# Patient Record
Sex: Male | Born: 1965 | Race: White | Hispanic: No | Marital: Married | Smoking: Current every day smoker
Health system: Southern US, Community
[De-identification: ages and names within clinical notes are randomized; demographics above are authoritative.]

## PROBLEM LIST (undated history)

## (undated) DIAGNOSIS — G4730 Sleep apnea, unspecified: Secondary | ICD-10-CM

## (undated) DIAGNOSIS — I10 Essential (primary) hypertension: Secondary | ICD-10-CM

## (undated) DIAGNOSIS — E785 Hyperlipidemia, unspecified: Secondary | ICD-10-CM

## (undated) DIAGNOSIS — K649 Unspecified hemorrhoids: Secondary | ICD-10-CM

## (undated) HISTORY — DX: Essential (primary) hypertension: I10

## (undated) HISTORY — PX: COLONOSCOPY: SHX174

## (undated) HISTORY — DX: Hyperlipidemia, unspecified: E78.5

---

## 2005-07-03 ENCOUNTER — Ambulatory Visit: Payer: Self-pay

## 2013-10-17 DIAGNOSIS — Z8719 Personal history of other diseases of the digestive system: Secondary | ICD-10-CM | POA: Insufficient documentation

## 2013-10-17 DIAGNOSIS — G4730 Sleep apnea, unspecified: Secondary | ICD-10-CM | POA: Insufficient documentation

## 2020-01-05 ENCOUNTER — Telehealth: Payer: Self-pay | Admitting: *Deleted

## 2020-01-05 NOTE — Telephone Encounter (Signed)
Attempted to contact regarding lung screening referral. No answer or voicemail option available.

## 2020-01-05 NOTE — Telephone Encounter (Signed)
Received a referral for lung screening scan. Attempted to contact but there is no answer or voicemail option available.

## 2020-01-17 ENCOUNTER — Telehealth: Payer: Self-pay | Admitting: *Deleted

## 2020-01-17 NOTE — Telephone Encounter (Signed)
Unsuccessfully attempted to contact regarding referral for lung cancer screening scan. Unable to leave message.

## 2020-02-02 ENCOUNTER — Telehealth: Payer: Self-pay | Admitting: *Deleted

## 2020-02-02 ENCOUNTER — Encounter: Payer: Self-pay | Admitting: *Deleted

## 2020-02-02 NOTE — Telephone Encounter (Signed)
Attempted to contact patient regarding lung screening scan. There is no answer or voicemail option. Will mail notification

## 2020-04-17 ENCOUNTER — Telehealth: Payer: Self-pay | Admitting: *Deleted

## 2020-04-17 DIAGNOSIS — Z87891 Personal history of nicotine dependence: Secondary | ICD-10-CM

## 2020-04-17 DIAGNOSIS — Z122 Encounter for screening for malignant neoplasm of respiratory organs: Secondary | ICD-10-CM

## 2020-04-17 NOTE — Telephone Encounter (Signed)
Received referral for initial lung cancer screening scan. Contacted patient and obtained smoking history,(current, 37 pack year) as well as answering questions related to screening process. Patient denies signs of lung cancer such as weight loss or hemoptysis. Patient denies comorbidity that would prevent curative treatment if lung cancer were found. Patient is scheduled for shared decision making visit and CT scan on 05/10/20 at 1015am.

## 2020-05-10 ENCOUNTER — Inpatient Hospital Stay: Payer: 59 | Admitting: Nurse Practitioner

## 2020-05-10 ENCOUNTER — Ambulatory Visit: Payer: 59

## 2020-05-22 ENCOUNTER — Inpatient Hospital Stay: Payer: 59 | Attending: Oncology | Admitting: Nurse Practitioner

## 2020-05-22 ENCOUNTER — Other Ambulatory Visit: Payer: Self-pay

## 2020-05-22 ENCOUNTER — Ambulatory Visit
Admission: RE | Admit: 2020-05-22 | Discharge: 2020-05-22 | Disposition: A | Discharge status: Home / Self Care | Payer: 59 | Source: Ambulatory Visit | Attending: Nurse Practitioner | Admitting: Nurse Practitioner

## 2020-05-22 ENCOUNTER — Encounter: Payer: Self-pay | Admitting: Nurse Practitioner

## 2020-05-22 DIAGNOSIS — Z87891 Personal history of nicotine dependence: Secondary | ICD-10-CM | POA: Diagnosis not present

## 2020-05-22 DIAGNOSIS — Z122 Encounter for screening for malignant neoplasm of respiratory organs: Secondary | ICD-10-CM | POA: Insufficient documentation

## 2020-05-22 NOTE — Progress Notes (Signed)
Virtual Visit via Video Enabled Telemedicine Note   I connected with Shaun Watson on 05/22/20 at 1:45 PM EST by video enabled telemedicine visit and verified that I am speaking with the correct person using two identifiers.   I discussed the limitations, risks, security and privacy concerns of performing an evaluation and management service by telemedicine and the availability of in-person appointments. I also discussed with the patient that there may be a patient responsible charge related to this service. The patient expressed understanding and agreed to proceed.   Other persons participating in the visit and their role in the encounter: Burgess Estelle, RN- checking in patient & navigation  Patient's location: [place]  Provider's location: Clinic  Chief Complaint: Low Dose CT Screening  Patient agreed to evaluation by telemedicine to discuss shared decision making for consideration of low dose CT lung cancer screening.    In accordance with CMS guidelines, patient has met eligibility criteria including age, absence of signs or symptoms of lung cancer.  Social History   Tobacco Use  . Smoking status: Current Every Day Smoker    Packs/day: 1.00    Years: 37.00    Pack years: 37.00    Types: Cigarettes  Substance Use Topics  . Alcohol use: Not on file     A shared decision-making session was conducted prior to the performance of CT scan. This includes one or more decision aids, includes benefits and harms of screening, follow-up diagnostic testing, over-diagnosis, false positive rate, and total radiation exposure.   Counseling on the importance of adherence to annual lung cancer LDCT screening, impact of co-morbidities, and ability or willingness to undergo diagnosis and treatment is imperative for compliance of the program.   Counseling on the importance of continued smoking cessation for former smokers; the importance of smoking cessation for current smokers, and  information about tobacco cessation interventions have been given to patient including [place] and [employer] [place] programs.   Written order for lung cancer screening with LDCT has been given to the patient and any and all questions have been answered to the best of my abilities.    Yearly follow up will be coordinated by Burgess Estelle, Thoracic Navigator.  I discussed the assessment and treatment plan with the patient. The patient was provided an opportunity to ask questions and all were answered. The patient agreed with the plan and demonstrated an understanding of the instructions.   The patient was advised to call back or seek an in-person evaluation if the symptoms worsen or if the condition fails to improve as anticipated.   I provided 15 minutes of face-to-face video visit time during this encounter, and > 50% was spent counseling as documented under my assessment & plan.   Beckey Rutter, DNP, AGNP-C [place] at [facility] [phone] (clinic)

## 2020-05-24 ENCOUNTER — Telehealth: Payer: Self-pay | Admitting: *Deleted

## 2020-05-24 NOTE — Telephone Encounter (Signed)
Notified patient of LDCT lung cancer screening program results with recommendation for 12 month follow up imaging. Also notified of incidental findings noted below and is encouraged to discuss further with PCP who will receive a copy of this note and/or the CT report. Patient verbalizes understanding.   IMPRESSION: 1. Lung-RADS 1, negative. Continue annual screening with low-dose chest CT without contrast in 12 months. 2. Aortic Atherosclerosis (ICD10-I70.0) and Emphysema (ICD10-J43.9). 3. Age advanced coronary artery atherosclerosis. Recommend assessment of coronary risk factors and consideration of medical therapy.  

## 2021-08-23 IMAGING — CT CT CHEST LUNG CANCER SCREENING LOW DOSE W/O CM
2 of 5 series · 15 of 40 positions shown, 18 images · non-contrast
Comparison: None.

CLINICAL DATA: Thirty-seven pack-year smoking history. Current
smoker.

EXAM:
CT CHEST WITHOUT CONTRAST LOW-DOSE FOR LUNG CANCER SCREENING
TECHNIQUE: Multidetector CT imaging of the chest was performed following the
standard protocol without IV contrast.

[Series 3: lung 1.00 · axial · 0.64mm/px · z∈[-1243,-939]mm · 12 of 336 slices shown, 15 images]
[im 16/336  mediastinal]
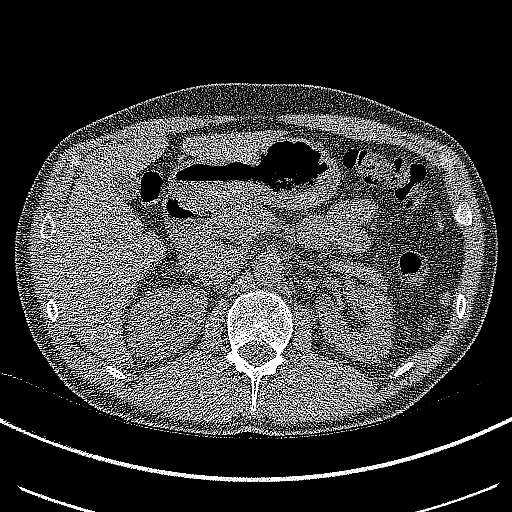
[im 16/336  lung]
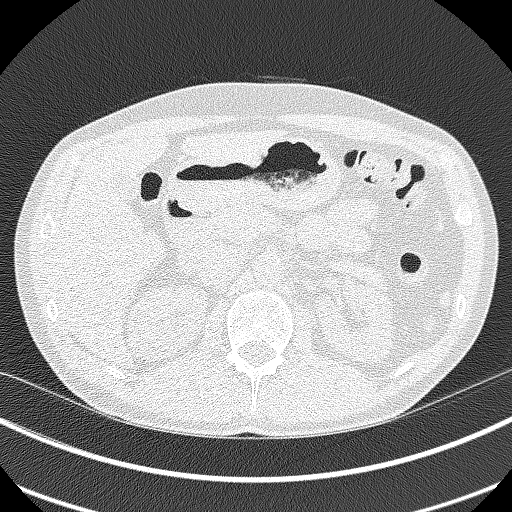
[im 46/336  lung]
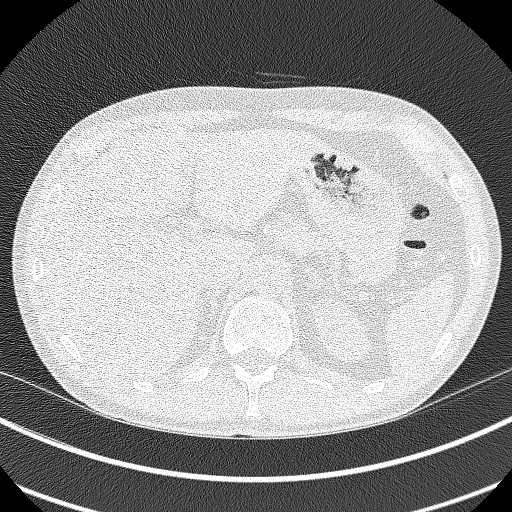
[im 77/336  lung]
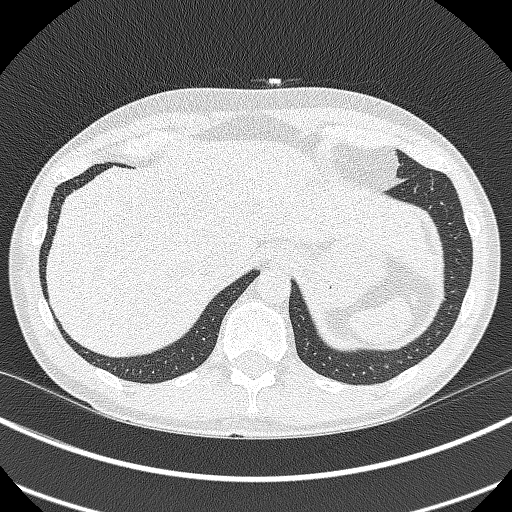
[im 107/336  lung]
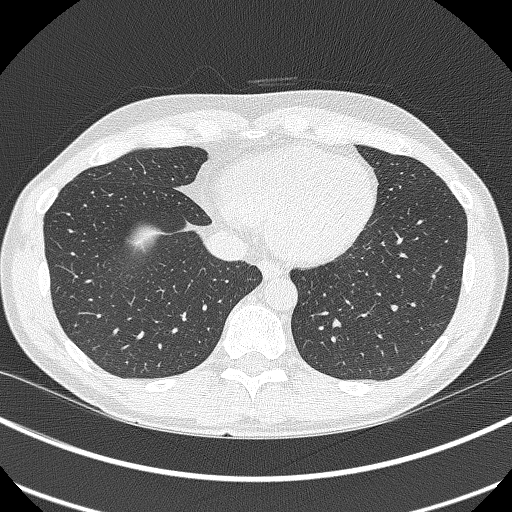
[im 122/336  mediastinal]
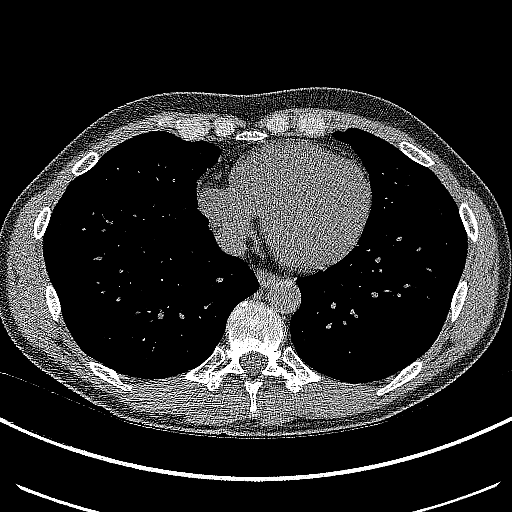
[im 122/336  lung]
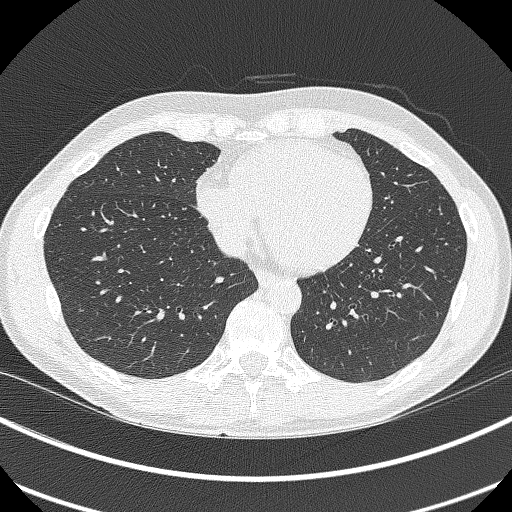
[im 153/336  lung]
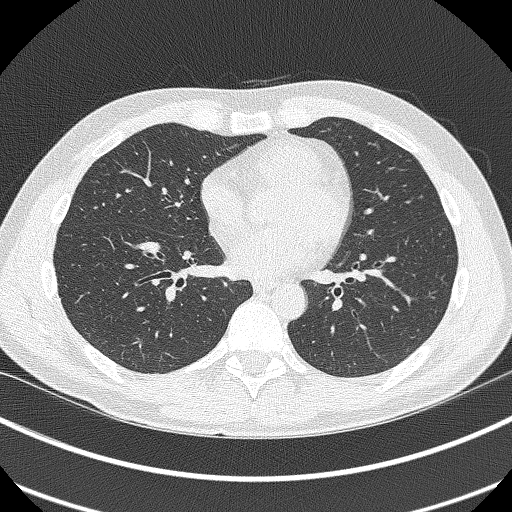
[im 183/336  lung]
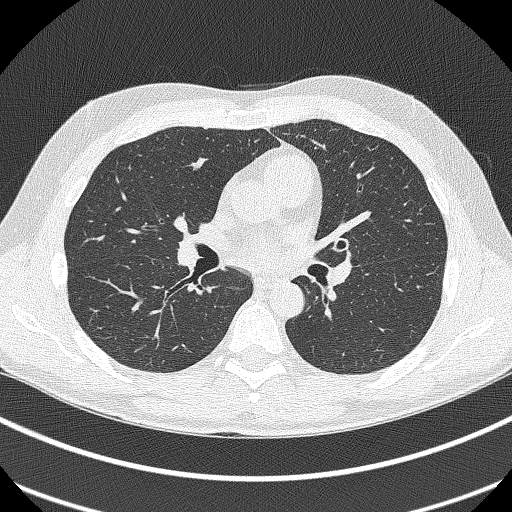
[im 214/336  lung]
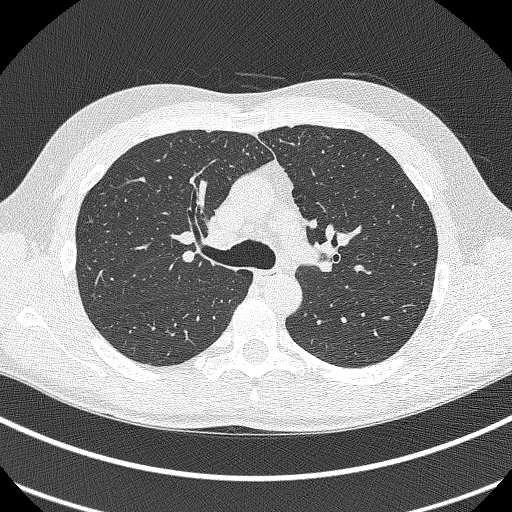
[im 229/336  mediastinal]
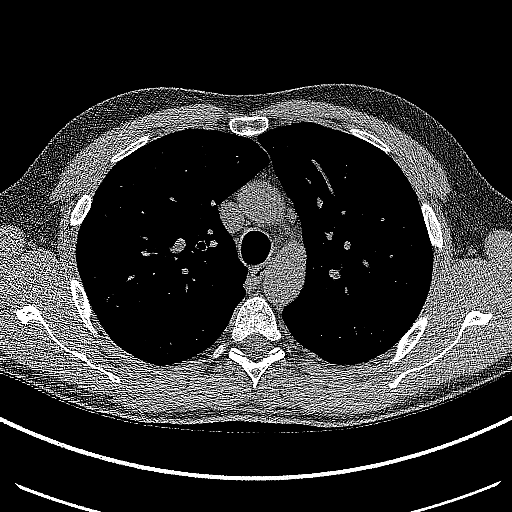
[im 229/336  lung]
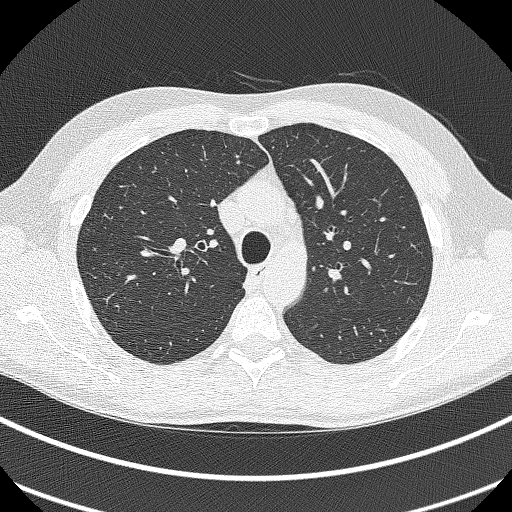
[im 259/336  lung]
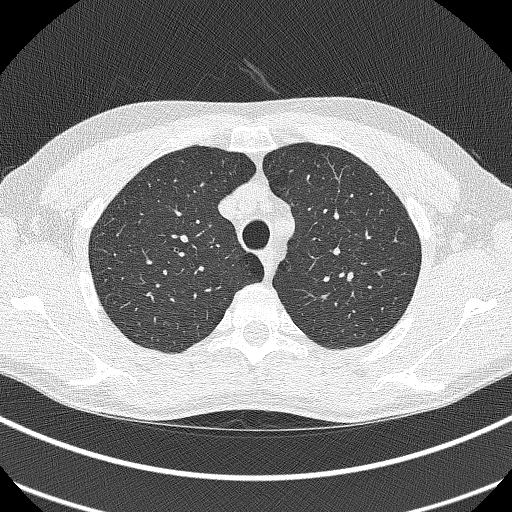
[im 290/336  lung]
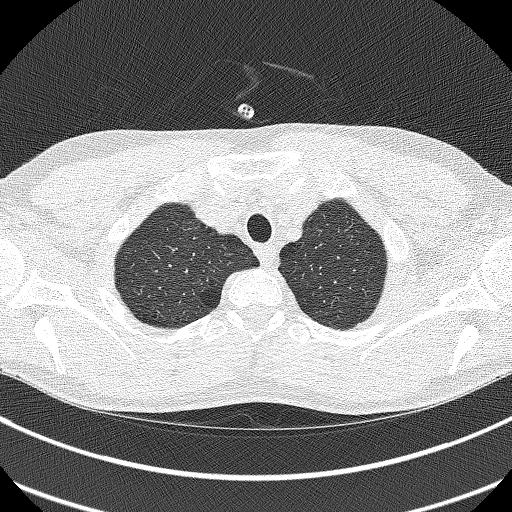
[im 320/336  lung]
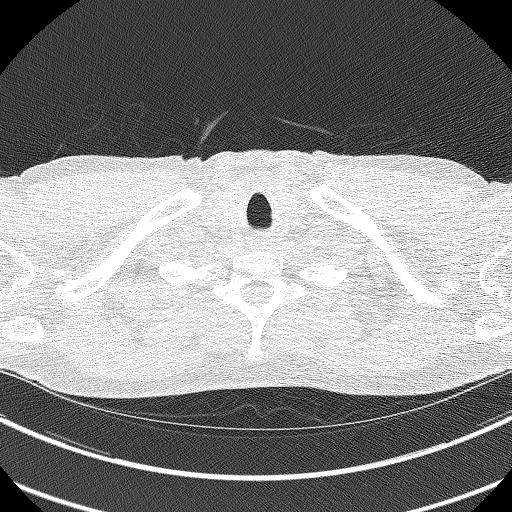

[Series 4: coronals lung 1.00 cor · coronal · 0.64mm/px · 3 of 237 slices shown]
[im 48/237  lung]
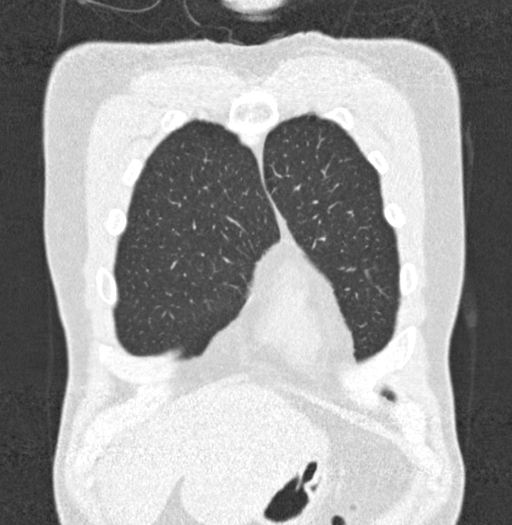
[im 95/237  lung]
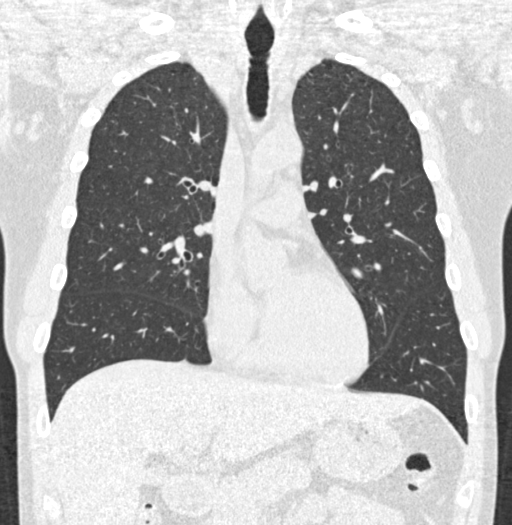
[im 142/237  lung]
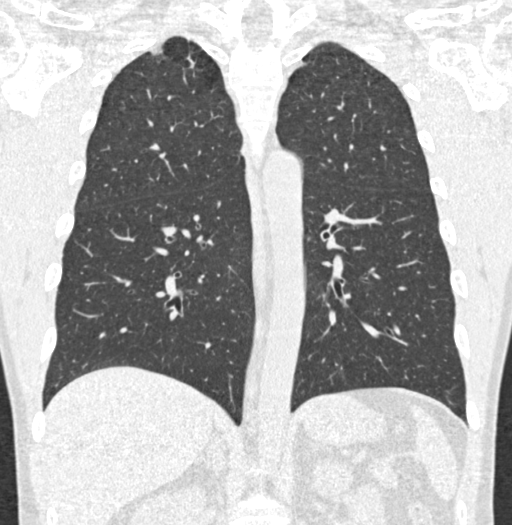

[15 of 40 positions shown; findings below may reference images not displayed]

FINDINGS: Cardiovascular: Aortic atherosclerosis. Tortuous thoracic aorta.
Normal heart size, without pericardial effusion. Lad and right
coronary artery calcification.

Mediastinum/Nodes: No mediastinal or definite hilar adenopathy,
given limitations of unenhanced CT.

Lungs/Pleura: No pleural fluid. Mild centrilobular and paraseptal
emphysema. Mild biapical pleuroparenchymal scarring. No suspicious
pulmonary nodule or mass.

Upper Abdomen: Normal imaged portions of the liver, spleen, stomach,
pancreas, gallbladder, adrenal glands, kidneys.

Musculoskeletal: No acute osseous abnormality.
IMPRESSION: 1. Lung-RADS 1, negative. Continue annual screening with low-dose
chest CT without contrast in 12 months.
2. Aortic Atherosclerosis (CA1UC-CPM.M) and Emphysema (CA1UC-1K2.9).
3. Age advanced coronary artery atherosclerosis. Recommend
assessment of coronary risk factors and consideration of medical
therapy.

## 2021-09-20 ENCOUNTER — Telehealth: Payer: Self-pay | Admitting: *Deleted

## 2021-09-20 NOTE — Telephone Encounter (Signed)
Left message for pt to call back to schedule f/u lung screening CT scan.  ?

## 2022-03-24 DIAGNOSIS — T63441A Toxic effect of venom of bees, accidental (unintentional), initial encounter: Secondary | ICD-10-CM | POA: Diagnosis not present

## 2022-03-24 DIAGNOSIS — L237 Allergic contact dermatitis due to plants, except food: Secondary | ICD-10-CM | POA: Diagnosis not present

## 2022-03-24 DIAGNOSIS — Z6823 Body mass index (BMI) 23.0-23.9, adult: Secondary | ICD-10-CM | POA: Diagnosis not present

## 2022-03-24 DIAGNOSIS — R69 Illness, unspecified: Secondary | ICD-10-CM | POA: Diagnosis not present

## 2022-07-25 DIAGNOSIS — F17200 Nicotine dependence, unspecified, uncomplicated: Secondary | ICD-10-CM | POA: Diagnosis not present

## 2022-07-25 DIAGNOSIS — Z6824 Body mass index (BMI) 24.0-24.9, adult: Secondary | ICD-10-CM | POA: Diagnosis not present

## 2022-07-25 DIAGNOSIS — T781XXA Other adverse food reactions, not elsewhere classified, initial encounter: Secondary | ICD-10-CM | POA: Diagnosis not present

## 2022-07-25 DIAGNOSIS — Z122 Encounter for screening for malignant neoplasm of respiratory organs: Secondary | ICD-10-CM | POA: Diagnosis not present

## 2022-07-28 DIAGNOSIS — Z86010 Personal history of colonic polyps: Secondary | ICD-10-CM | POA: Diagnosis not present

## 2022-09-23 ENCOUNTER — Encounter: Payer: Self-pay | Admitting: Internal Medicine

## 2022-09-24 ENCOUNTER — Ambulatory Visit: Payer: 59 | Admitting: General Practice

## 2022-09-24 ENCOUNTER — Encounter: Payer: Self-pay | Admitting: Internal Medicine

## 2022-09-24 ENCOUNTER — Other Ambulatory Visit: Payer: Self-pay

## 2022-09-24 ENCOUNTER — Encounter
Admission: RE | Disposition: A | Discharge status: Home / Self Care | Payer: Self-pay | Source: Home / Self Care | Attending: Internal Medicine

## 2022-09-24 ENCOUNTER — Ambulatory Visit
Admission: RE | Admit: 2022-09-24 | Discharge: 2022-09-24 | Disposition: A | Discharge status: Home / Self Care | Payer: 59 | Attending: Internal Medicine | Admitting: Internal Medicine

## 2022-09-24 DIAGNOSIS — Z09 Encounter for follow-up examination after completed treatment for conditions other than malignant neoplasm: Secondary | ICD-10-CM | POA: Insufficient documentation

## 2022-09-24 DIAGNOSIS — Z86010 Personal history of colonic polyps: Secondary | ICD-10-CM | POA: Diagnosis not present

## 2022-09-24 DIAGNOSIS — Z1211 Encounter for screening for malignant neoplasm of colon: Secondary | ICD-10-CM | POA: Diagnosis not present

## 2022-09-24 DIAGNOSIS — K640 First degree hemorrhoids: Secondary | ICD-10-CM | POA: Insufficient documentation

## 2022-09-24 HISTORY — DX: Unspecified hemorrhoids: K64.9

## 2022-09-24 HISTORY — PX: COLONOSCOPY: SHX5424

## 2022-09-24 HISTORY — DX: Sleep apnea, unspecified: G47.30

## 2022-09-24 SURGERY — COLONOSCOPY
Anesthesia: General

## 2022-09-24 MED ORDER — PROPOFOL 10 MG/ML IV BOLUS
INTRAVENOUS | Status: AC
  Filled 2022-09-24: qty 20

## 2022-09-24 MED ORDER — SODIUM CHLORIDE 0.9 % IV SOLN: INTRAVENOUS | Status: DC

## 2022-09-24 MED ORDER — PROPOFOL 10 MG/ML IV BOLUS
INTRAVENOUS | Status: DC | PRN
  Administered 2022-09-24: 50 mg via INTRAVENOUS

## 2022-09-24 MED ORDER — PROPOFOL 500 MG/50ML IV EMUL
INTRAVENOUS | Status: DC | PRN
  Administered 2022-09-24: 150 ug/kg/min via INTRAVENOUS

## 2022-09-24 NOTE — Anesthesia Postprocedure Evaluation (Signed)
Anesthesia Post Note  Patient: Shaun Watson  Procedure(s) Performed: COLONOSCOPY  Patient location during evaluation: Endoscopy Anesthesia Type: General Level of consciousness: awake and alert Pain management: pain level controlled Vital Signs Assessment: post-procedure vital signs reviewed and stable Respiratory status: spontaneous breathing, nonlabored ventilation, respiratory function stable and patient connected to nasal cannula oxygen Cardiovascular status: blood pressure returned to baseline and stable Postop Assessment: no apparent nausea or vomiting Anesthetic complications: no  No notable events documented.   Last Vitals:  Vitals:   09/24/22 1130 09/24/22 1140  BP: (!) 99/57 118/74  Pulse: 63 69  Resp: 13 14  Temp:    SpO2: 98% 100%    Last Pain:  Vitals:   09/24/22 1140  TempSrc:   PainSc: 0-No pain                 [name]

## 2022-09-24 NOTE — Interval H&P Note (Signed)
History and Physical Interval Note:  09/24/2022 10:47 AM  Shaun Watson  has presented today for surgery, with the diagnosis of Hx of adenomatous colonic polyps (Z86.010).  The various methods of treatment have been discussed with the patient and family. After consideration of risks, benefits and other options for treatment, the patient has consented to  Procedure(s): COLONOSCOPY (N/A) as a surgical intervention.  The patient's history has been reviewed, patient examined, no change in status, stable for surgery.  I have reviewed the patient's chart and labs.  Questions were answered to the patient's satisfaction.     [place], [place]

## 2022-09-24 NOTE — Op Note (Signed)
[facility] Gastroenterology Patient Name: Shaun Watson Procedure Date: 09/24/2022 10:48 AM MRN: [id number] Account #: [id number] Date of Birth: [birth year] Admit Type: Outpatient Age: 57 Room: [facility] ENDO ROOM 2 Gender: Male Note Status: Finalized Instrument Name: Jasper Riling [id number] Procedure:             Colonoscopy Indications:           High risk colon cancer surveillance: Personal history                         of adenoma with high grade dysplasia Providers:             Benay Pike. Alice Reichert MD, MD Referring MD:          Benay Pike. Alice Reichert MD, MD (Referring MD), Skeet Latch (Referring MD) Medicines:             Propofol per Anesthesia Complications:         No immediate complications. Procedure:             Pre-Anesthesia Assessment:                        - The risks and benefits of the procedure and the                         sedation options and risks were discussed with the                         patient. All questions were answered and informed                         consent was obtained.                        - Patient identification and proposed procedure were                         verified prior to the procedure by the nurse. The                         procedure was verified in the procedure room.                        - ASA Grade Assessment: II - A patient with mild                         systemic disease.                        - After reviewing the risks and benefits, the patient                         was deemed in satisfactory condition to undergo the                         procedure.  After obtaining informed consent, the colonoscope was                         passed under direct vision. Throughout the procedure,                         the patient's blood pressure, pulse, and oxygen                         saturations were monitored continuously. The                          Colonoscope was introduced through the anus and                         advanced to the the cecum, identified by appendiceal                         orifice and ileocecal valve. The colonoscopy was                         performed without difficulty. The patient tolerated                         the procedure well. The quality of the bowel                         preparation was adequate. The ileocecal valve,                         appendiceal orifice, and rectum were photographed. Findings:      The perianal and digital rectal examinations were normal. Pertinent       negatives include normal sphincter tone and no palpable rectal lesions.      Non-bleeding internal hemorrhoids were found during retroflexion. The       hemorrhoids were Grade I (internal hemorrhoids that do not prolapse).      A tattoo was seen in the rectum. The tattoo site appeared normal.      The exam was otherwise without abnormality. Impression:            - Non-bleeding internal hemorrhoids.                        - A tattoo was seen in the rectum. The tattoo site                         appeared normal.                        - The examination was otherwise normal.                        - No specimens collected. Recommendation:        - Patient has a contact number available for                         emergencies. The signs and symptoms of potential  delayed complications were discussed with the patient.                         Return to normal activities tomorrow. Written                         discharge instructions were provided to the patient.                        - Resume previous diet.                        - Continue present medications.                        - Repeat colonoscopy in 3 years for surveillance.                        - Return to GI office PRN.                        - The findings and recommendations were discussed with                         the  patient. Procedure Code(s):     --- Professional ---                        [id number], Colorectal cancer screening; colonoscopy on                         individual at high risk Diagnosis Code(s):     --- Professional ---                        K64.0, First degree hemorrhoids                        Z86.010, Personal history of colonic polyps CPT copyright 2022 American Medical Association. All rights reserved. The codes documented in this report are preliminary and upon coder review may  be revised to meet current compliance requirements. Efrain Sella MD, MD 09/24/2022 11:17:29 AM This report has been signed electronically. Number of Addenda: 0 Note Initiated On: 09/24/2022 10:48 AM Scope Withdrawal Time: 0 hours 5 minutes 50 seconds  Total Procedure Duration: 0 hours 12 minutes 21 seconds  Estimated Blood Loss:  Estimated blood loss: none.      [facility]

## 2022-09-24 NOTE — Transfer of Care (Signed)
Immediate Anesthesia Transfer of Care Note  Patient: Shaun Watson  Procedure(s) Performed: COLONOSCOPY  Patient Location: PACU  Anesthesia Type:General  Level of Consciousness: awake and sedated  Airway & Oxygen Therapy: Patient Spontanous Breathing and Patient connected to nasal cannula oxygen  Post-op Assessment: Report given to RN and Post -op Vital signs reviewed and stable  Post vital signs: Reviewed and stable  Last Vitals:  Vitals Value Taken Time  BP 89/57 09/24/22 1120  Temp    Pulse 75 09/24/22 1121  Resp 12 09/24/22 1121  SpO2 96 % 09/24/22 1121  Vitals shown include unvalidated device data.  Last Pain:  Vitals:   09/24/22 1120  TempSrc:   PainSc: 0-No pain         Complications: No notable events documented.

## 2022-09-24 NOTE — Anesthesia Preprocedure Evaluation (Signed)
Anesthesia Evaluation  Patient identified by MRN, date of birth, ID band Patient awake    Reviewed: Allergy & Precautions, NPO status , Patient's Chart, lab work & pertinent test results  Airway Mallampati: III  TM Distance: >3 FB Neck ROM: full    Dental  (+) Chipped   Pulmonary neg pulmonary ROS, neg COPD, Current Smoker   Pulmonary exam normal        Cardiovascular (-) Past MI and (-) CABG negative cardio ROS Normal cardiovascular exam     Neuro/Psych negative neurological ROS  negative psych ROS   GI/Hepatic negative GI ROS, Neg liver ROS,,,  Endo/Other  negative endocrine ROS    Renal/GU negative Renal ROS  negative genitourinary   Musculoskeletal   Abdominal   Peds  Hematology negative hematology ROS (+)   Anesthesia Other Findings Past Medical History: No date: Hemorrhoids No date: Sleep apnea  Past Surgical History: No date: COLONOSCOPY  BMI    Body Mass Index: 23.73 kg/m      Reproductive/Obstetrics negative OB ROS                             Anesthesia Physical Anesthesia Plan  ASA: 2  Anesthesia Plan: General   Post-op Pain Management: Minimal or no pain anticipated   Induction: Intravenous  PONV Risk Score and Plan: 3 and Propofol infusion, TIVA and Ondansetron  Airway Management Planned: Nasal Cannula  Additional Equipment: None  Intra-op Plan:   Post-operative Plan:   Informed Consent: I have reviewed the patients History and Physical, chart, labs and discussed the procedure including the risks, benefits and alternatives for the proposed anesthesia with the patient or authorized representative who has indicated his/her understanding and acceptance.     Dental advisory given  Plan Discussed with: CRNA and Surgeon  Anesthesia Plan Comments: (Discussed risks of anesthesia with patient, including possibility of difficulty with spontaneous ventilation  under anesthesia necessitating airway intervention, PONV, and rare risks such as cardiac or respiratory or neurological events, and allergic reactions. Discussed the role of CRNA in patient's perioperative care. Patient understands.)       Anesthesia Quick Evaluation

## 2022-09-24 NOTE — H&P (Signed)
Outpatient short stay form Pre-procedure 09/24/2022 10:47 AM Norah Devin K. Alice Reichert, M.D.  Primary Physician: Mia Creek, M.D.  Reason for visit:  Personal hx of advanced adenoma of the colon (2020)  History of present illness:   Mr. Whipp presents to the [facility] GI clinic for chief complaint of high-risk colon cancer surveillance 2/2 personal history of advanced adenoma. Last colonoscopy performed by Dr. Alice Reichert 01/2021 showed one 25 mm polyp in mid-rectum removed piecemeal and hemostatic clip placed with path showing submucosal leiomyoma negative for dysplasia or malignancy. A 1-year repeat was recommended given his history of TVA with high-grade dysplasia s/p EMR at [facility] 08/2018. He denies any acute GI complaints or concerns at this time. He denies any recent changes in his bowel habits. Bowels are regular and denies any issues with diarrhea, constipation, hematochezia, melena, fecal urgency, or fecal incontinence. He denies any issues with abdominal pain or abdominal cramping. Appetite and diet are stable without any unintentional weight loss. He denies any UGI symptoms such as heartburn, acid reflux, nausea, vomiting, epigastric abdominal pain, dysphagia, or odynophagia. No other questions or concerns offered at this time.    Current Facility-Administered Medications:    0.9 %  sodium chloride infusion, , Intravenous, Continuous, Aolani Piggott, Benay Pike, MD, Last Rate: 20 mL/hr at 09/24/22 1025, Continued from Pre-op at 09/24/22 1025  No medications prior to admission.     Not on File   Past Medical History:  Diagnosis Date   Hemorrhoids    Sleep apnea     Review of systems:  Otherwise negative.    Physical Exam  Gen: Alert, oriented. Appears stated age.  HEENT: [place]/AT. PERRLA. Lungs: CTA, no wheezes. CV: RR nl S1, S2. Abd: soft, benign, no masses. BS+ Ext: No edema. Pulses 2+    Planned procedures: Proceed with colonoscopy. The patient understands the nature of the planned  procedure, indications, risks, alternatives and potential complications including but not limited to bleeding, infection, perforation, damage to internal organs and possible oversedation/side effects from anesthesia. The patient agrees and gives consent to proceed.  Please refer to procedure notes for findings, recommendations and patient disposition/instructions.     Qadir Folks K. Alice Reichert, M.D. Gastroenterology 09/24/2022  10:47 AM

## 2022-09-25 ENCOUNTER — Encounter: Payer: Self-pay | Admitting: Internal Medicine

## 2022-10-23 DIAGNOSIS — Z6824 Body mass index (BMI) 24.0-24.9, adult: Secondary | ICD-10-CM | POA: Diagnosis not present

## 2022-10-23 DIAGNOSIS — Z122 Encounter for screening for malignant neoplasm of respiratory organs: Secondary | ICD-10-CM | POA: Diagnosis not present

## 2022-10-23 DIAGNOSIS — F17200 Nicotine dependence, unspecified, uncomplicated: Secondary | ICD-10-CM | POA: Diagnosis not present

## 2022-10-23 DIAGNOSIS — R030 Elevated blood-pressure reading, without diagnosis of hypertension: Secondary | ICD-10-CM | POA: Diagnosis not present

## 2022-10-24 DIAGNOSIS — R030 Elevated blood-pressure reading, without diagnosis of hypertension: Secondary | ICD-10-CM | POA: Diagnosis not present

## 2022-11-25 ENCOUNTER — Other Ambulatory Visit: Payer: Self-pay | Admitting: Family Medicine

## 2022-11-25 DIAGNOSIS — E782 Mixed hyperlipidemia: Secondary | ICD-10-CM | POA: Diagnosis not present

## 2022-11-25 DIAGNOSIS — Z122 Encounter for screening for malignant neoplasm of respiratory organs: Secondary | ICD-10-CM | POA: Diagnosis not present

## 2022-11-25 DIAGNOSIS — R030 Elevated blood-pressure reading, without diagnosis of hypertension: Secondary | ICD-10-CM | POA: Diagnosis not present

## 2022-11-25 DIAGNOSIS — Z6824 Body mass index (BMI) 24.0-24.9, adult: Secondary | ICD-10-CM | POA: Diagnosis not present

## 2022-11-25 DIAGNOSIS — F17209 Nicotine dependence, unspecified, with unspecified nicotine-induced disorders: Secondary | ICD-10-CM

## 2022-12-05 ENCOUNTER — Ambulatory Visit
Admission: RE | Admit: 2022-12-05 | Discharge: 2022-12-05 | Disposition: A | Discharge status: Home / Self Care | Payer: 59 | Source: Ambulatory Visit | Attending: Family Medicine | Admitting: Family Medicine

## 2022-12-05 DIAGNOSIS — F17209 Nicotine dependence, unspecified, with unspecified nicotine-induced disorders: Secondary | ICD-10-CM | POA: Insufficient documentation

## 2022-12-05 DIAGNOSIS — F17210 Nicotine dependence, cigarettes, uncomplicated: Secondary | ICD-10-CM | POA: Diagnosis not present

## 2023-01-20 ENCOUNTER — Telehealth: Payer: Self-pay

## 2023-01-20 NOTE — Telephone Encounter (Signed)
Called and LVM to inquire about any cardiac history. The patient is scheduled to see Dr. Azucena Cecil as a new patient on 01/27/2023. Requested the patient to call back at their earliest convenience.

## 2023-01-27 ENCOUNTER — Encounter: Payer: Self-pay | Admitting: Cardiology

## 2023-01-27 ENCOUNTER — Ambulatory Visit: Payer: 59 | Attending: Cardiology | Admitting: Cardiology

## 2023-01-27 VITALS — BP 130/80 | HR 71 | Ht 66.0 in | Wt 148.4 lb

## 2023-01-27 DIAGNOSIS — I2510 Atherosclerotic heart disease of native coronary artery without angina pectoris: Secondary | ICD-10-CM | POA: Diagnosis not present

## 2023-01-27 DIAGNOSIS — I2584 Coronary atherosclerosis due to calcified coronary lesion: Secondary | ICD-10-CM

## 2023-01-27 DIAGNOSIS — E7800 Pure hypercholesterolemia, unspecified: Secondary | ICD-10-CM

## 2023-01-27 DIAGNOSIS — F17200 Nicotine dependence, unspecified, uncomplicated: Secondary | ICD-10-CM

## 2023-01-27 MED ORDER — ASPIRIN 81 MG PO TBEC: 81.0000 mg | DELAYED_RELEASE_TABLET | Freq: Every day | ORAL | Status: AC

## 2023-01-27 NOTE — Progress Notes (Signed)
Cardiology Office Note:    Date:  01/27/2023   ID:  Shaun Watson, DOB 1965-09-11, MRN [id number]  PCP:  Alease Medina, MD    [facility] Providers Cardiologist:  Debbe Odea, MD     Referring MD: Alease Medina, MD   Chief Complaint  Patient presents with   New Patient (Initial Visit)    Referred for hyperlipidemia evaluation.  No cardiac history   Shaun Watson is a 57 y.o. male who is being seen today for the evaluation of CAD at the request of Ziglar, Eli Phillips, MD.   History of Present Illness:    Shaun Watson is a 57 y.o. male with a hx of CAD  (coronary calcifications on chest CT 5/24),  hyperlipidemia, current smoker x 40+ years who presents due to elevated cholesterol.  And coronary calcifications.  Patient is a current smoker, heart or lung cancer screening chest CT on 5/24 showing coronary calcifications.  Emphysema also noted.  He denies chest pain or shortness of breath.  Recently diagnosed with hyperlipidemia, started taking Lipitor 20 mg daily consistently a month ago.  Also takes aspirin 81 mg daily.  Overall he states doing well, has no concerns at this time.  Past Medical History:  Diagnosis Date   Hemorrhoids    Hyperlipidemia    Hypertension    Sleep apnea     Past Surgical History:  Procedure Laterality Date   COLONOSCOPY     COLONOSCOPY N/A 09/24/2022   Procedure: COLONOSCOPY;  Surgeon: Toledo, Boykin Nearing, MD;  Location: [facility] ENDOSCOPY;  Service: Gastroenterology;  Laterality: N/A;    Current Medications: Current Meds  Medication Sig   aspirin EC 81 MG tablet Take 1 tablet (81 mg total) by mouth daily. Swallow whole.   atorvastatin (LIPITOR) 20 MG tablet Take 20 mg by mouth daily.     Allergies:   Nickel   Social History   Socioeconomic History   Marital status: Married    Spouse name: Not on file   Number of children: Not on file   Years of education: Not on file   Highest education level: Not on file   Occupational History   Not on file  Tobacco Use   Smoking status: Every Day    Current packs/day: 1.00    Average packs/day: 1 pack/day for 37.0 years (37.0 ttl pk-yrs)    Types: Cigarettes   Smokeless tobacco: Not on file  Vaping Use   Vaping status: Never Used  Substance and Sexual Activity   Alcohol use: Not Currently    Alcohol/week: 10.0 standard drinks of alcohol    Types: 10 Shots of liquor per week   Drug use: Not Currently   Sexual activity: Not on file  Other Topics Concern   Not on file  Social History Narrative   Not on file   Social Determinants of Health   Financial Resource Strain: Not on file  Food Insecurity: Not on file  Transportation Needs: Not on file  Physical Activity: Not on file  Stress: Not on file  Social Connections: Not on file     Family History: The patient's family history includes Heart disease in his father.  ROS:   Please see the history of present illness.     All other systems reviewed and are negative.  EKGs/Labs/Other Studies Reviewed:    The following studies were reviewed today:  EKG Interpretation Date/Time:  Tuesday January 27 2023 09:06:30 EDT Ventricular Rate:  71 PR Interval:  128 QRS Duration:  88 QT Interval:  384 QTC Calculation: 417 R Axis:   54  Text Interpretation: Normal sinus rhythm Normal ECG Confirmed by Debbe Odea ([id number]) on 01/27/2023 9:08:45 AM    Recent Labs: No results found for requested labs within last 365 days.  Recent Lipid Panel No results found for: "CHOL", "TRIG", "HDL", "CHOLHDL", "VLDL", "LDLCALC", "LDLDIRECT"  Outside lipid panel obtained at PCPs office showed total cholesterol 245, HDL 55, LDL 178.  Risk Assessment/Calculations:             Physical Exam:    VS:  BP 130/80 (BP Location: Right Arm, Patient Position: Sitting, Cuff Size: Normal)   Pulse 71   Ht 5\' 6"  (1.676 m)   Wt 148 lb 6.4 oz (67.3 kg)   SpO2 98%   BMI 23.95 kg/m     Wt Readings from Last 3  Encounters:  01/27/23 148 lb 6.4 oz (67.3 kg)  09/24/22 147 lb (66.7 kg)  05/22/20 145 lb (65.8 kg)     GEN:  Well nourished, well developed in no acute distress HEENT: Normal NECK: No JVD; No carotid bruits CARDIAC: RRR, no murmurs, rubs, gallops RESPIRATORY:  Clear to auscultation without rales, wheezing or rhonchi  ABDOMEN: Soft, non-tender, non-distended MUSCULOSKELETAL:  No edema; No deformity  SKIN: Warm and dry NEUROLOGIC:  Alert and oriented x 3 PSYCHIATRIC:  Normal affect   ASSESSMENT:    1. Coronary artery disease due to calcified coronary lesion   2. Pure hypercholesterolemia   3. Smoking    PLAN:    In order of problems listed above:  LAD and RCA calcifications on chest CT.  Denies chest pain.  Continue aspirin 81 mg daily, Lipitor 20 mg daily.  Obtain echocardiogram. Hyperlipidemia, continue Lipitor 20 mg daily.  Repeat fasting lipid profile in 4 to 6 weeks. Current smoker, smoking cessation advised.  Follow-up after cardiac testing      Medication Adjustments/Labs and Tests Ordered: Current medicines are reviewed at length with the patient today.  Concerns regarding medicines are outlined above.  Orders Placed This Encounter  Procedures   Lipid panel   EKG 12-Lead   ECHOCARDIOGRAM COMPLETE   Meds ordered this encounter  Medications   aspirin EC 81 MG tablet    Sig: Take 1 tablet (81 mg total) by mouth daily. Swallow whole.    Patient Instructions  Medication Instructions:   Your physician recommends that you continue on your current medications as directed. Please refer to the Current Medication list given to you today.  *If you need a refill on your cardiac medications before your next appointment, please call your pharmacy*   Lab Work:  Your physician recommends that you return for lab work the day of your Echocardiogram at the medical mall. You will need to be fasting. No appt is needed. Hours are M-F 7AM- 6 PM.  If you have labs (blood  work) drawn today and your tests are completely normal, you will receive your results only by: MyChart Message (if you have MyChart) OR A paper copy in the mail If you have any lab test that is abnormal or we need to change your treatment, we will call you to review the results.   Testing/Procedures:  Your physician has requested that you have an echocardiogram. Echocardiography is a painless test that uses sound waves to create images of your heart. It provides your doctor with information about the size and shape of your heart and how well your heart's  chambers and valves are working. This procedure takes approximately one hour. There are no restrictions for this procedure. Please do NOT wear cologne, perfume, aftershave, or lotions (deodorant is allowed). Please arrive 15 minutes prior to your appointment time.   Follow-Up: At [facility], you and your health needs are our priority.  As part of our continuing mission to provide you with exceptional heart care, we have created designated Provider Care Teams.  These Care Teams include your primary Cardiologist (physician) and Advanced Practice Providers (APPs -  Physician Assistants and Nurse Practitioners) who all work together to provide you with the care you need, when you need it.  We recommend signing up for the patient portal called "MyChart".  Sign up information is provided on this After Visit Summary.  MyChart is used to connect with patients for Virtual Visits (Telemedicine).  Patients are able to view lab/test results, encounter notes, upcoming appointments, etc.  Non-urgent messages can be sent to your provider as well.   To learn more about what you can do with MyChart, go to [email or web address].    Your next appointment:    After Echocardiogram  Provider:   You may see Debbe Odea, MD or one of the following Advanced Practice Providers on your designated Care Team:   Nicolasa Ducking, NP Eula Listen,  PA-C Cadence Fransico Michael, PA-C Charlsie Quest, NP    Signed, Debbe Odea, MD  01/27/2023 9:54 AM    [facility]

## 2023-01-27 NOTE — Patient Instructions (Signed)
Medication Instructions:   Your physician recommends that you continue on your current medications as directed. Please refer to the Current Medication list given to you today.  *If you need a refill on your cardiac medications before your next appointment, please call your pharmacy*   Lab Work:  Your physician recommends that you return for lab work the day of your Echocardiogram at the medical mall. You will need to be fasting. No appt is needed. Hours are M-F 7AM- 6 PM.  If you have labs (blood work) drawn today and your tests are completely normal, you will receive your results only by: MyChart Message (if you have MyChart) OR A paper copy in the mail If you have any lab test that is abnormal or we need to change your treatment, we will call you to review the results.   Testing/Procedures:  Your physician has requested that you have an echocardiogram. Echocardiography is a painless test that uses sound waves to create images of your heart. It provides your doctor with information about the size and shape of your heart and how well your heart's chambers and valves are working. This procedure takes approximately one hour. There are no restrictions for this procedure. Please do NOT wear cologne, perfume, aftershave, or lotions (deodorant is allowed). Please arrive 15 minutes prior to your appointment time.   Follow-Up: At [facility], you and your health needs are our priority.  As part of our continuing mission to provide you with exceptional heart care, we have created designated Provider Care Teams.  These Care Teams include your primary Cardiologist (physician) and Advanced Practice Providers (APPs -  Physician Assistants and Nurse Practitioners) who all work together to provide you with the care you need, when you need it.  We recommend signing up for the patient portal called "MyChart".  Sign up information is provided on this After Visit Summary.  MyChart is used to  connect with patients for Virtual Visits (Telemedicine).  Patients are able to view lab/test results, encounter notes, upcoming appointments, etc.  Non-urgent messages can be sent to your provider as well.   To learn more about what you can do with MyChart, go to [email or web address].    Your next appointment:    After Echocardiogram  Provider:   You may see Debbe Odea, MD or one of the following Advanced Practice Providers on your designated Care Team:   Nicolasa Ducking, NP Eula Listen, PA-C Cadence Fransico Michael, PA-C Charlsie Quest, NP

## 2023-02-25 ENCOUNTER — Ambulatory Visit: Payer: 59 | Attending: Cardiology

## 2023-02-25 ENCOUNTER — Other Ambulatory Visit
Admission: RE | Admit: 2023-02-25 | Discharge: 2023-02-25 | Disposition: A | Discharge status: Home / Self Care | Payer: 59 | Attending: Cardiology | Admitting: Cardiology

## 2023-02-25 DIAGNOSIS — I2584 Coronary atherosclerosis due to calcified coronary lesion: Secondary | ICD-10-CM

## 2023-02-25 DIAGNOSIS — E7800 Pure hypercholesterolemia, unspecified: Secondary | ICD-10-CM | POA: Diagnosis not present

## 2023-02-25 DIAGNOSIS — I081 Rheumatic disorders of both mitral and tricuspid valves: Secondary | ICD-10-CM

## 2023-02-25 DIAGNOSIS — I2510 Atherosclerotic heart disease of native coronary artery without angina pectoris: Secondary | ICD-10-CM | POA: Diagnosis not present

## 2023-02-25 LAB — ECHOCARDIOGRAM COMPLETE
Area-P 1/2: 3.48 cm2
Calc EF: 43.9 %
S' Lateral: 3.1 cm
Single Plane A2C EF: 50.6 %
Single Plane A4C EF: 38.9 %

## 2023-02-25 LAB — LIPID PANEL
Cholesterol: 170 mg/dL (ref 0–200)
HDL: 51 mg/dL (ref >40)
LDL Cholesterol: 100 mg/dL — ABNORMAL HIGH (ref 0–99)
Total CHOL/HDL Ratio: 3.3 RATIO
Triglycerides: 93 mg/dL (ref <150)
VLDL: 19 mg/dL (ref 0–40)

## 2023-02-26 ENCOUNTER — Telehealth: Payer: Self-pay

## 2023-02-26 ENCOUNTER — Telehealth: Payer: Self-pay | Admitting: Cardiology

## 2023-02-26 MED ORDER — ATORVASTATIN CALCIUM 40 MG PO TABS: 40.0000 mg | ORAL_TABLET | Freq: Every day | ORAL | 3 refills | Status: DC

## 2023-02-26 NOTE — Telephone Encounter (Signed)
Updated medication list

## 2023-02-26 NOTE — Telephone Encounter (Signed)
Patient states he is returning a call. 

## 2023-02-26 NOTE — Telephone Encounter (Signed)
Left message for patient to call back for lab results and recommendations. 

## 2023-02-27 NOTE — Telephone Encounter (Signed)
Margrett Rud, [place] 02/27/2023  8:53 AM EDT  Called patient. Left detailed message on VM - okay per DPR

## 2023-03-03 NOTE — Progress Notes (Unsigned)
Cardiology Office Note    Date:  03/05/2023   ID:  Shaun Watson, Shaun Watson 24-Jan-1966, MRN [id number]  PCP:  Alease Medina, MD  Cardiologist:  Debbe Odea, MD  Electrophysiologist:  None   Chief Complaint: Follow-up  History of Present Illness:   Shaun Watson is a 57 y.o. male with history of coronary artery calcification noted on CT imaging, emphysema with ongoing tobacco use of 40+ years, HTN, and HLD who presents for follow-up of echo.  He was evaluated as a new patient by Dr. Azucena Cecil in 01/2023 for coronary artery calcification noted on lung cancer screening CT imaging in 05/2020 and 11/2022.  He was without symptoms of angina or cardiac decompensation.  CT at that time did demonstrate emphysema.  No prior ischemic testing.  He had recently been initiated on atorvastatin and was taking aspirin 81 mg.  Echo in 02/2023 showed an EF of 55%, no regional wall motion abnormalities, normal LV diastolic function parameters, normal RV systolic function and ventricular cavity size, mild mitral regurgitation, and an estimated right atrial pressure of 8 mmHg.  He comes in doing well from a cardiac perspective and is without symptoms of angina or cardiac decompensation.  No dizziness, presyncope, or syncope.  No palpitations.  He remains active at baseline without cardiac limitation.  He does continue to smoke approximately 1 pack/day and has done so for the past 40 years.  Not interested in quitting tobacco at this time.  No family history of premature or significant CAD.  He reports he was previously not taking atorvastatin on a regular basis, typically 4 to 5 days/week.  Now taking daily.  Overall, feels like he is doing well and does not have any acute cardiac concerns at this time.   Labs independently reviewed: 02/2023 - TC 170, TG 93, HDL 51, LDL 100 12/2019 - Hgb 15.0, PLT 267, potassium 4.8, BUN 12, serum creatinine 0.9, albumin 4.3, AST/ALT normal  Past Medical History:  Diagnosis  Date   Hemorrhoids    Hyperlipidemia    Hypertension    Sleep apnea     Past Surgical History:  Procedure Laterality Date   COLONOSCOPY     COLONOSCOPY N/A 09/24/2022   Procedure: COLONOSCOPY;  Surgeon: Toledo, Boykin Nearing, MD;  Location: [facility] ENDOSCOPY;  Service: Gastroenterology;  Laterality: N/A;    Current Medications: Current Meds  Medication Sig   aspirin EC 81 MG tablet Take 1 tablet (81 mg total) by mouth daily. Swallow whole.   [DISCONTINUED] atorvastatin (LIPITOR) 40 MG tablet Take 1 tablet (40 mg total) by mouth daily.    Allergies:   Nickel   Social History   Socioeconomic History   Marital status: Married    Spouse name: Not on file   Number of children: Not on file   Years of education: Not on file   Highest education level: Not on file  Occupational History   Not on file  Tobacco Use   Smoking status: Every Day    Current packs/day: 1.00    Average packs/day: 1 pack/day for 37.0 years (37.0 ttl pk-yrs)    Types: Cigarettes   Smokeless tobacco: Not on file  Vaping Use   Vaping status: Never Used  Substance and Sexual Activity   Alcohol use: Yes    Alcohol/week: 10.0 standard drinks of alcohol    Types: 10 Shots of liquor per week   Drug use: Not Currently   Sexual activity: Not on file  Other Topics Concern  Not on file  Social History Narrative   Not on file   Social Determinants of Health   Financial Resource Strain: Not on file  Food Insecurity: Not on file  Transportation Needs: Not on file  Physical Activity: Not on file  Stress: Not on file  Social Connections: Not on file     Family History:  The patient's family history includes Heart disease in his father.  ROS:   12-point review of systems is negative unless otherwise noted in the HPI.   EKGs/Labs/Other Studies Reviewed:    Studies reviewed were summarized above. The additional studies were reviewed today:  2D echo 02/25/2023: 1. Left ventricular ejection fraction, by  estimation, is 55 %. The left  ventricle has normal function. The left ventricle has no regional wall  motion abnormalities. Left ventricular diastolic parameters were normal.  The average left ventricular global  longitudinal strain is -19.1 %.   2. Right ventricular systolic function is normal. The right ventricular  size is normal.   3. The mitral valve is normal in structure. Mild mitral valve  regurgitation. No evidence of mitral stenosis.   4. The aortic valve is tricuspid. Aortic valve regurgitation is not  visualized. No aortic stenosis is present.   5. The inferior vena cava is normal in size with <50% respiratory  variability, suggesting right atrial pressure of 8 mmHg.    EKG:  EKG is not ordered today.    Recent Labs: No results found for requested labs within last 365 days.  Recent Lipid Panel    Component Value Date/Time   CHOL 170 02/25/2023 0735   TRIG 93 02/25/2023 0735   HDL 51 02/25/2023 0735   CHOLHDL 3.3 02/25/2023 0735   VLDL 19 02/25/2023 0735   LDLCALC 100 (H) 02/25/2023 0735    PHYSICAL EXAM:    VS:  BP 118/74 (BP Location: Left Arm, Patient Position: Sitting, Cuff Size: Normal)   Pulse 71   Ht 5\' 6"  (1.676 m)   Wt 153 lb 6 oz (69.6 kg)   SpO2 98%   BMI 24.76 kg/m   BMI: Body mass index is 24.76 kg/m.  Physical Exam Vitals reviewed.  Constitutional:      Appearance: He is well-developed.  HENT:     Head: Normocephalic and atraumatic.  Eyes:     General:        Right eye: No discharge.        Left eye: No discharge.  Neck:     Vascular: No JVD.  Cardiovascular:     Rate and Rhythm: Normal rate and regular rhythm.     Heart sounds: Normal heart sounds, S1 normal and S2 normal. Heart sounds not distant. No midsystolic click and no opening snap. No murmur heard.    No friction rub.  Pulmonary:     Effort: Pulmonary effort is normal. No respiratory distress.     Breath sounds: Normal breath sounds. No decreased breath sounds, wheezing or  rales.  Chest:     Chest wall: No tenderness.  Abdominal:     General: There is no distension.  Musculoskeletal:     Cervical back: Normal range of motion.     Right lower leg: No edema.     Left lower leg: No edema.  Skin:    General: Skin is warm and dry.     Nails: There is no clubbing.  Neurological:     Mental Status: He is alert and oriented to person, place, and time.  Psychiatric:        Speech: Speech normal.        Behavior: Behavior normal.        Thought Content: Thought content normal.        Judgment: Judgment normal.     Wt Readings from Last 3 Encounters:  03/05/23 153 lb 6 oz (69.6 kg)  01/27/23 148 lb 6.4 oz (67.3 kg)  09/24/22 147 lb (66.7 kg)     ASSESSMENT & PLAN:   CAD involving the native coronary arteries without: He is doing well and without symptoms concerning for angina or cardiac decompensation.  Prior CT scans from 2021 and earlier this year have demonstrated coronary artery calcification.  Pursue GXT for further risk stratification.  Recommend aggressive risk factor modification including continuation of aspirin and statin.  Complete smoking cessation is encouraged.  HTN: Blood pressure is well-controlled in the office today.  Not requiring antihypertensive medications.  HLD: LDL 100 and 02/2023 with goal being less than 70.  Now taking atorvastatin on a regular basis.  In this setting, we will reduce atorvastatin to 20 mg daily and follow-up fasting lipid panel and LFT in 3 months.  If LDL remains above goal at that time, while taking daily, would then look to escalate atorvastatin.  Emphysema with tobacco use: No acute exacerbation.  Not ready to quit smoking at this time.  Advised him to contact us when/if he becomes ready.   Informed Consent   Shared Decision Making/Informed Consent  The risks [chest pain, shortness of breath, cardiac arrhythmias, dizziness, blood pressure fluctuations, myocardial infarction, stroke/transient ischemic attack,  and life-threatening complications (estimated to be 1 in 10,000)], benefits (risk stratification, diagnosing coronary artery disease, treatment guidance) and alternatives of an exercise tolerance test were discussed in detail with Mr. Waver and he agrees to proceed.      Disposition: F/u with Dr. Azucena Cecil or an APP in 6 months.   Medication Adjustments/Labs and Tests Ordered: Current medicines are reviewed at length with the patient today.  Concerns regarding medicines are outlined above. Medication changes, Labs and Tests ordered today are summarized above and listed in the Patient Instructions accessible in Encounters.   SignedEula Listen, PA-C 03/05/2023 10:26 AM     [facility] [address] [place], [place] [address] [phone]

## 2023-03-05 ENCOUNTER — Ambulatory Visit: Payer: 59 | Attending: Physician Assistant | Admitting: Physician Assistant

## 2023-03-05 ENCOUNTER — Encounter: Payer: Self-pay | Admitting: Physician Assistant

## 2023-03-05 VITALS — BP 118/74 | HR 71 | Ht 66.0 in | Wt 153.4 lb

## 2023-03-05 DIAGNOSIS — I2510 Atherosclerotic heart disease of native coronary artery without angina pectoris: Secondary | ICD-10-CM | POA: Diagnosis not present

## 2023-03-05 DIAGNOSIS — I10 Essential (primary) hypertension: Secondary | ICD-10-CM

## 2023-03-05 DIAGNOSIS — Z720 Tobacco use: Secondary | ICD-10-CM

## 2023-03-05 DIAGNOSIS — I2584 Coronary atherosclerosis due to calcified coronary lesion: Secondary | ICD-10-CM | POA: Diagnosis not present

## 2023-03-05 DIAGNOSIS — E785 Hyperlipidemia, unspecified: Secondary | ICD-10-CM | POA: Diagnosis not present

## 2023-03-05 MED ORDER — ATORVASTATIN CALCIUM 20 MG PO TABS: 20.0000 mg | ORAL_TABLET | Freq: Every day | ORAL | 3 refills | Status: DC

## 2023-03-05 NOTE — Patient Instructions (Signed)
Medication Instructions:  Your physician recommends the following medication changes.  DECREASE: Lipitor to 20 mg daily   *If you need a refill on your cardiac medications before your next appointment, please call your pharmacy*   Lab Work:  Your provider would like for you to return in 3 months to have the following labs drawn: Lipid and liver panel.   Please go to [facility] [address] ([facility] [address], [place] [address] You do not need an appointment.  They are open from 7:30 am-4 pm.  Lunch from 1:00 pm- 2:00 pm You WILL need to be fasting.   If you have labs (blood work) drawn today and your tests are completely normal, you will receive your results only by: MyChart Message (if you have MyChart) OR A paper copy in the mail If you have any lab test that is abnormal or we need to change your treatment, we will call you to review the results.   Testing/Procedures: Your provider has ordered a exercise tolerance test. This test will evaluate the blood supply to your heart muscle during periods of exercise and rest. For this test, you will raise your heart rate by walking on a treadmill at different levels.   you may eat a light breakfast/ lunch prior to your procedure no caffeine for 24 hours prior to your test (coffee, tea, soft drinks, or chocolate)  no smoking/ vaping for 4 hours prior to your test you may take your regular medications the day of your test bring any inhalers with you to your test wear comfortable clothing & tennis/ non-skid shoes to walk on the treadmill  This will take place at [address] ([facility]) [address], [place] [address]    Follow-Up: At [facility], you and your health needs are our priority.  As part of our continuing mission to provide you with exceptional heart care, we have created designated Provider Care Teams.  These Care Teams include your primary Cardiologist (physician) and  Advanced Practice Providers (APPs -  Physician Assistants and Nurse Practitioners) who all work together to provide you with the care you need, when you need it.  We recommend signing up for the patient portal called "MyChart".  Sign up information is provided on this After Visit Summary.  MyChart is used to connect with patients for Virtual Visits (Telemedicine).  Patients are able to view lab/test results, encounter notes, upcoming appointments, etc.  Non-urgent messages can be sent to your provider as well.   To learn more about what you can do with MyChart, go to [email or web address].    Your next appointment:   6 month(s)  Provider:   You may see Debbe Odea, MD or one of the following Advanced Practice Providers on your designated Care Team:    Eula Listen, [place]

## 2023-03-17 ENCOUNTER — Ambulatory Visit
Admission: RE | Admit: 2023-03-17 | Discharge: 2023-03-17 | Disposition: A | Discharge status: Home / Self Care | Payer: 59 | Source: Ambulatory Visit | Attending: Physician Assistant | Admitting: Physician Assistant

## 2023-03-17 DIAGNOSIS — J449 Chronic obstructive pulmonary disease, unspecified: Secondary | ICD-10-CM | POA: Diagnosis not present

## 2023-03-17 DIAGNOSIS — E785 Hyperlipidemia, unspecified: Secondary | ICD-10-CM | POA: Insufficient documentation

## 2023-03-17 DIAGNOSIS — I2510 Atherosclerotic heart disease of native coronary artery without angina pectoris: Secondary | ICD-10-CM | POA: Insufficient documentation

## 2023-03-17 DIAGNOSIS — I10 Essential (primary) hypertension: Secondary | ICD-10-CM | POA: Insufficient documentation

## 2023-03-17 DIAGNOSIS — I2584 Coronary atherosclerosis due to calcified coronary lesion: Secondary | ICD-10-CM | POA: Diagnosis not present

## 2023-03-18 LAB — EXERCISE TOLERANCE TEST
Angina Index: 0
Duke Treadmill Score: 11
Estimated workload: 13.4
Exercise duration (min): 11 min
Exercise duration (sec): 1 s
MPHR: 165 {beats}/min
Peak HR: 164 {beats}/min
Percent HR: 99 %
Rest HR: 68 {beats}/min
ST Depression (mm): 0 mm

## 2023-08-05 ENCOUNTER — Encounter: Payer: Self-pay | Admitting: Family Medicine

## 2023-08-05 ENCOUNTER — Ambulatory Visit (INDEPENDENT_AMBULATORY_CARE_PROVIDER_SITE_OTHER): Payer: 59 | Admitting: Family Medicine

## 2023-08-05 VITALS — BP 132/79 | HR 82 | Temp 97.5°F | Resp 14 | Ht 66.0 in | Wt 151.2 lb

## 2023-08-05 DIAGNOSIS — E782 Mixed hyperlipidemia: Secondary | ICD-10-CM

## 2023-08-05 DIAGNOSIS — F17200 Nicotine dependence, unspecified, uncomplicated: Secondary | ICD-10-CM | POA: Insufficient documentation

## 2023-08-05 DIAGNOSIS — F17210 Nicotine dependence, cigarettes, uncomplicated: Secondary | ICD-10-CM

## 2023-08-05 DIAGNOSIS — Z0389 Encounter for observation for other suspected diseases and conditions ruled out: Secondary | ICD-10-CM

## 2023-08-05 MED ORDER — ATORVASTATIN CALCIUM 20 MG PO TABS: 20.0000 mg | ORAL_TABLET | Freq: Every day | ORAL | 3 refills | Status: AC

## 2023-08-05 NOTE — Progress Notes (Signed)
Established Patient Office Visit  Subjective   Patient ID: Shaun Watson, male    DOB: Feb 07, 1966  Age: 58 y.o. 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  He stopped drinking red bull about 3 weeks ago He had an LDCT that showed coronary calcifications.  Saw cardiology 8/24 had an echo EF 55%, and RWMA, normal LV diastolic function and mild mitral regurg.  He did an exercise treadmill test for 11 minutes and was considered low risk for ischemic heart disease. Cardiology put him on 40 mg of Lipitor.  LDL 100 with a goal less than 70.  He has only been taking Lipitor a few days a week and he is taking half.  He is taking an 81 mg aspirin but it is about 3-4 times a week. Discussed the flu vaccine pneumonia and shingles that he denied all of these been PHQ-9 0 GAD-7 3.   He drinks 2 shots of whiskey every day. He had a colonoscopy 3/24 no polyps were found.  He has an area of tattooing in the rectum and that looked normal.    ROS    Objective:     BP 132/79 (BP Location: Right Arm, Patient Position: Sitting, Cuff Size: Normal)   Pulse 82   Temp (!) 97.5 F (36.4 C) (Oral)   Resp 14   Ht 5\' 6"  (1.676 m) Comment: per patient  Wt 151 lb 3.2 oz (68.6 kg)   SpO2 96%   BMI 24.40 kg/m    Physical Exam Vitals and nursing note reviewed.  Constitutional:      Appearance: Normal appearance.  HENT:     Head: Normocephalic and atraumatic.  Eyes:     Conjunctiva/sclera: Conjunctivae normal.  Cardiovascular:     Rate and Rhythm: Normal rate and regular rhythm.  Pulmonary:     Effort: Pulmonary effort is normal.     Breath sounds: Normal breath sounds.  Musculoskeletal:     Right lower leg: No edema.     Left lower leg: No edema.  Skin:    General: Skin is warm and dry.  Neurological:     Mental Status: He is  alert and oriented to person, place, and time.  Psychiatric:        Mood and Affect: Mood normal.        Behavior: Behavior normal.        Thought Content: Thought content normal.        Judgment: Judgment normal.          No results found for any visits on 08/05/23.    The 10-year ASCVD risk score (Arnett DK, et al., 2019) is: 9.6%    Assessment & Plan:  Mixed hyperlipidemia Assessment & Plan: On Lipitor 20 mg several days a week.  Strongly encouraged him to take it daily.  Will check labs after 90 days.  Goal is LDL 70 or less   Cigarette nicotine dependence without complication Assessment & Plan: He is not interested in quitting smoking.  Talked him today about [place] quit now.  Does not want any pharmacologic help to stop smoking.   Coronary artery disease (CAD) excluded Assessment & Plan: 8/24 echo 55%, NR WMA, normal LV diastolic function.  Exercise treadmill for 11 minutes.  Considered low risk for ischemic heart  disease.      Return in about 3 months (around 11/03/2023).    Alease Medina, MD

## 2023-08-05 NOTE — Assessment & Plan Note (Signed)
On Lipitor 20 mg several days a week.  Strongly encouraged him to take it daily.  Will check labs after 90 days.  Goal is LDL 70 or less

## 2023-08-05 NOTE — Assessment & Plan Note (Signed)
8/24 echo 55%, NR WMA, normal LV diastolic function.  Exercise treadmill for 11 minutes.  Considered low risk for ischemic heart disease.

## 2023-08-05 NOTE — Assessment & Plan Note (Signed)
He is not interested in quitting smoking.  Talked him today about [place] quit now.  Does not want any pharmacologic help to stop smoking.

## 2023-11-03 ENCOUNTER — Encounter: Payer: Self-pay | Admitting: Family Medicine

## 2023-11-03 ENCOUNTER — Ambulatory Visit (INDEPENDENT_AMBULATORY_CARE_PROVIDER_SITE_OTHER): Payer: 59 | Admitting: Family Medicine

## 2023-11-03 VITALS — BP 117/79 | HR 72 | Temp 97.5°F | Resp 20 | Ht 66.0 in | Wt 149.0 lb

## 2023-11-03 DIAGNOSIS — F17210 Nicotine dependence, cigarettes, uncomplicated: Secondary | ICD-10-CM

## 2023-11-03 DIAGNOSIS — Z0389 Encounter for observation for other suspected diseases and conditions ruled out: Secondary | ICD-10-CM

## 2023-11-03 DIAGNOSIS — Z125 Encounter for screening for malignant neoplasm of prostate: Secondary | ICD-10-CM

## 2023-11-03 DIAGNOSIS — E782 Mixed hyperlipidemia: Secondary | ICD-10-CM

## 2023-11-03 NOTE — Assessment & Plan Note (Signed)
 Smokes a pack a day and has for greater than 40 years.  No intention of quitting anytime soon.

## 2023-11-03 NOTE — Assessment & Plan Note (Signed)
 On atorvastatin  20 mg daily and has been consistently taking this.  Checking lipid profile.  Cardiology request LDL goal of less than 70.

## 2023-11-03 NOTE — Progress Notes (Signed)
   Established Patient Office Visit  Subjective   Patient ID: Shaun Watson, male    DOB: March 22, 1966  Age: 58 y.o. MRN: [id number]  Chief Complaint  Patient presents with   Medical Management of Chronic Issues    HPI  Delightful 58 year old gentleman with mixed hyperlipidemia, emphysema, coronary artery calcium , smoker 03/06/24 LVEF 55%. Stress test was normal with no signs of ischemia.  He smoking about a pack a day and has no interest in quitting at this time. He is on atorvastatin  20 mg daily last labs total 170, Triggs 93, HDL 51 and LDL 100.  Cardiology would like a goal LDL less than 70. Reports she is cut back on drinking.  Had 2 shots the other day but probably had been 3 weeks since his last alcohol consumption.    ROS    Objective:     BP 117/79 (BP Location: Left Arm, Patient Position: Sitting, Cuff Size: Normal)   Pulse 72   Temp (!) 97.5 F (36.4 C) (Oral)   Resp 20   Ht 5\' 6"  (1.676 m)   Wt 149 lb (67.6 kg)   SpO2 93%   BMI 24.05 kg/m    Physical Exam Vitals and nursing note reviewed.  Constitutional:      Appearance: Normal appearance.  HENT:     Head: Normocephalic and atraumatic.  Eyes:     Conjunctiva/sclera: Conjunctivae normal.  Cardiovascular:     Rate and Rhythm: Normal rate and regular rhythm.  Pulmonary:     Effort: Pulmonary effort is normal.     Breath sounds: Normal breath sounds.  Musculoskeletal:     Right lower leg: No edema.     Left lower leg: No edema.  Skin:    General: Skin is warm and dry.  Neurological:     Mental Status: He is alert and oriented to person, place, and time.  Psychiatric:        Mood and Affect: Mood normal.        Behavior: Behavior normal.        Thought Content: Thought content normal.        Judgment: Judgment normal.          No results found for any visits on 11/03/23.    The 10-year ASCVD risk score (Arnett DK, et al., 2019) is: 7.8%    Assessment & Plan:  Mixed  hyperlipidemia Assessment & Plan: On atorvastatin  20 mg daily and has been consistently taking this.  Checking lipid profile.  Cardiology request LDL goal of less than 70.  Orders: -     Lipid panel -     PSA -     Comprehensive metabolic panel with GFR -     CBC with Differential/Platelet -     TSH + free T4  Screening for prostate cancer -     PSA  Coronary artery disease (CAD) excluded Assessment & Plan: Cardiac echo 824 and RWMA, LVEF 55%. Exercise treadmill test was normal with no evidence of ischemia.  Good exercise tolerance, exercised for 11 minutes.   Cigarette nicotine dependence without complication Assessment & Plan: Smokes a pack a day and has for greater than 40 years.  No intention of quitting anytime soon.      Return in about 6 months (around 05/04/2024).    Jodene Polyak K Abbe Bula, MD

## 2023-11-03 NOTE — Assessment & Plan Note (Signed)
 Cardiac echo 824 and RWMA, LVEF 55%. Exercise treadmill test was normal with no evidence of ischemia.  Good exercise tolerance, exercised for 11 minutes.

## 2023-12-18 ENCOUNTER — Encounter: Payer: Self-pay | Admitting: Cardiology
